# Patient Record
Sex: Male | Born: 2000 | Race: Black or African American | Hispanic: No | Marital: Single | Smoking: Never smoker
Health system: Southern US, Community
[De-identification: ages and names within clinical notes are randomized; demographics above are authoritative.]

## PROBLEM LIST (undated history)

## (undated) HISTORY — PX: SHOULDER SURGERY: SHX246

---
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
## 2021-06-21 DIAGNOSIS — Z0000 Encounter for general adult medical examination without abnormal findings: Secondary | ICD-10-CM | POA: Diagnosis not present

## 2021-06-27 ENCOUNTER — Telehealth: Payer: Self-pay | Admitting: Family Medicine

## 2021-06-27 NOTE — Telephone Encounter (Signed)
Immunization record printed and up front ready for pickup. Mom is aware

## 2021-06-27 NOTE — Telephone Encounter (Signed)
Nurse-patient needs copy of shot record.

## 2021-06-29 DIAGNOSIS — Z23 Encounter for immunization: Secondary | ICD-10-CM | POA: Diagnosis not present

## 2021-10-05 IMAGING — CT CT HEAD W/O CM
3 series · 16 of 47 positions shown, 19 images · non-contrast
Comparison: None.

CLINICAL DATA: Headache, dizziness, and vomiting.

EXAM:
CT HEAD WITHOUT CONTRAST
TECHNIQUE: Contiguous axial images were obtained from the base of the skull
through the vertex without intravenous contrast.

[Series 2: head w o · axial · 0.42mm/px · z∈[+75,+200]mm · 10 of 31 slices shown, 13 images]
[im 3/31  brain]
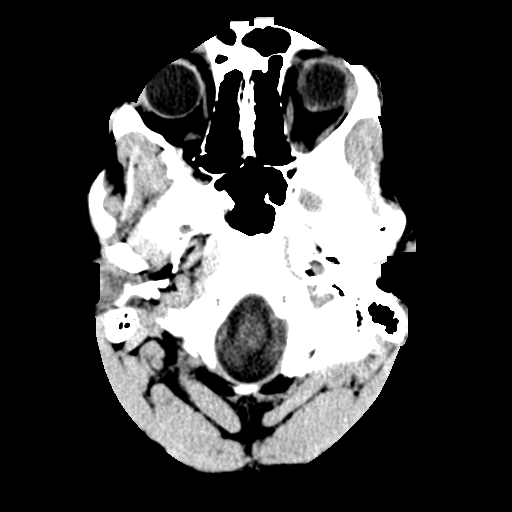
[im 3/31  bone]
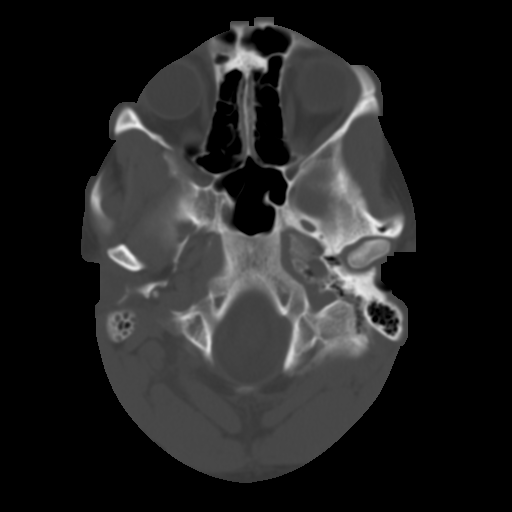
[im 6/31  brain]
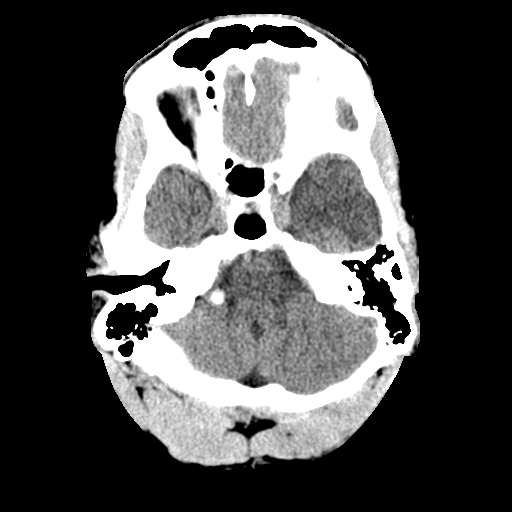
[im 9/31  brain]
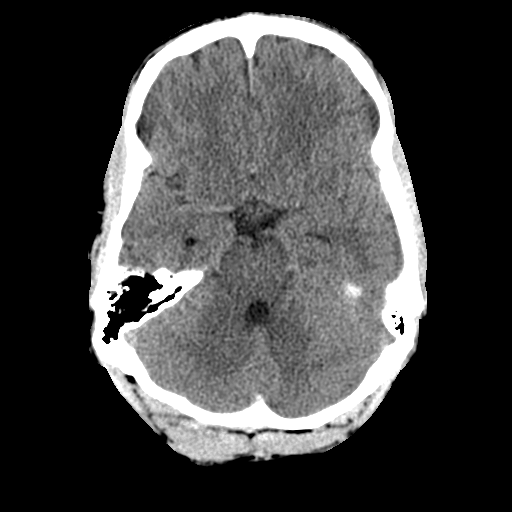
[im 11/31  brain]
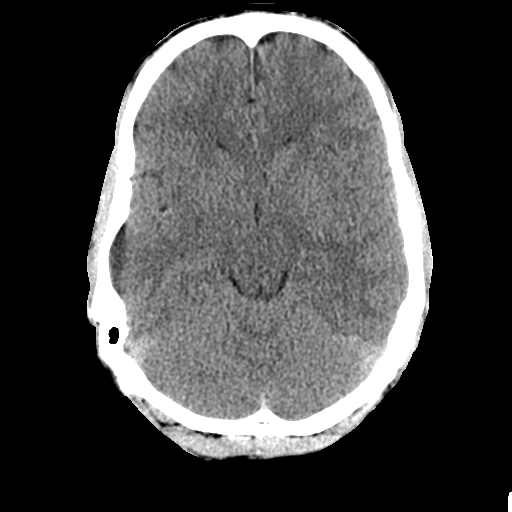
[im 14/31  brain]
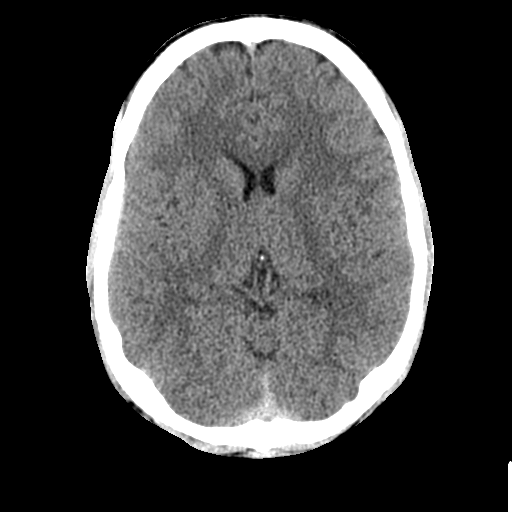
[im 14/31  bone]
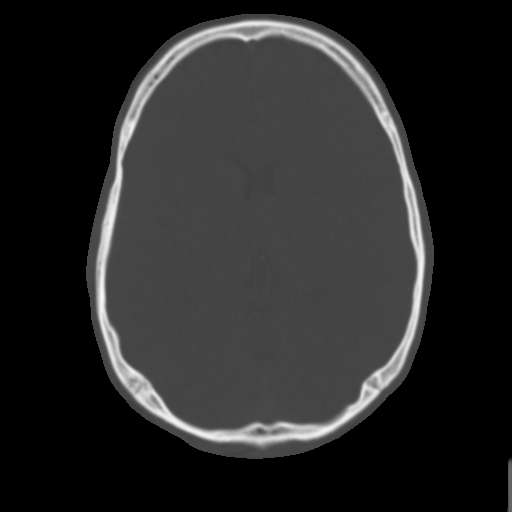
[im 17/31  brain]
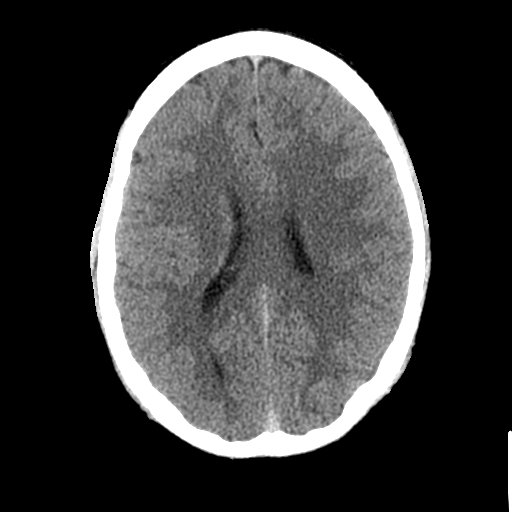
[im 20/31  brain]
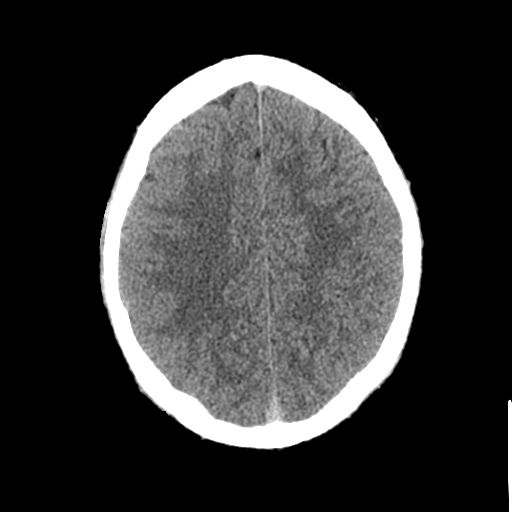
[im 23/31  brain]
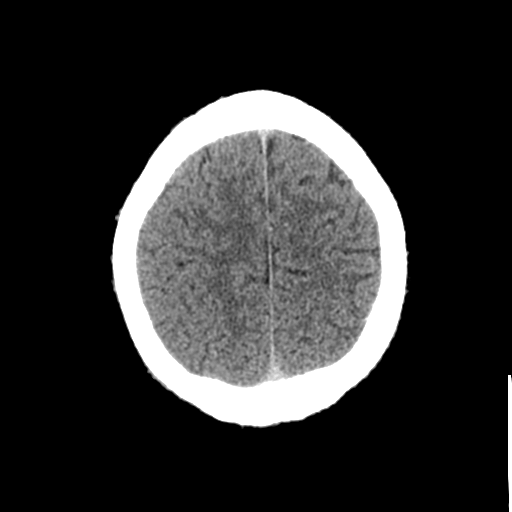
[im 25/31  brain]
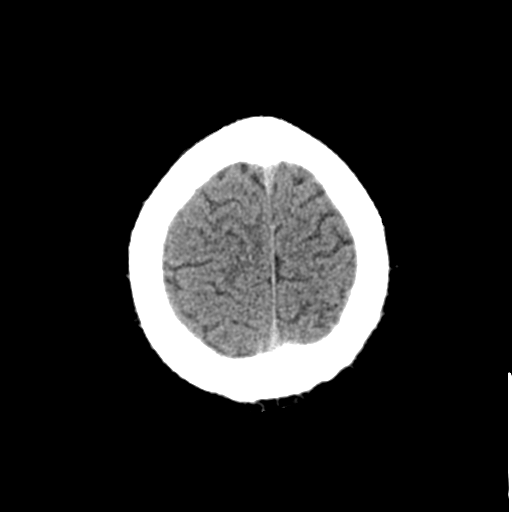
[im 25/31  bone]
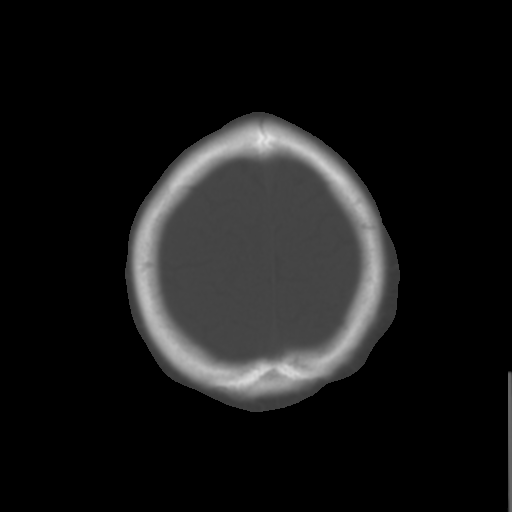
[im 28/31  brain]
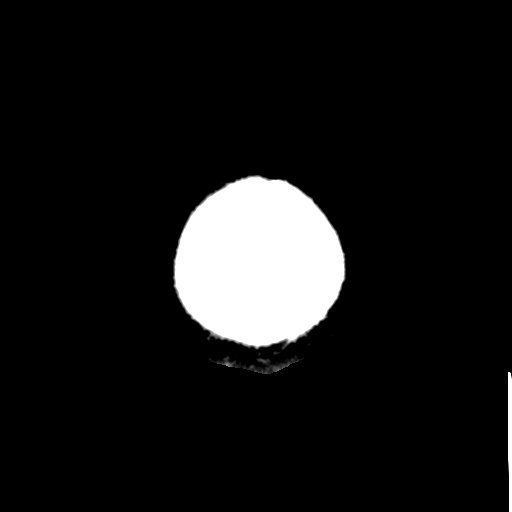

[Series 4: coronal soft · coronal · 0.33mm/px · 3 of 68 slices shown]
[im 23/68  brain]
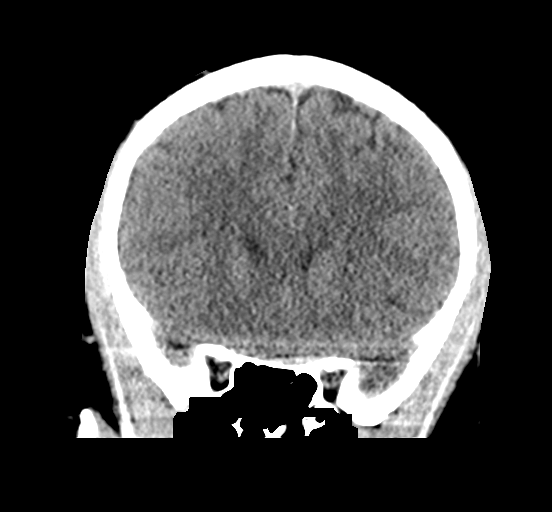
[im 30/68  brain]
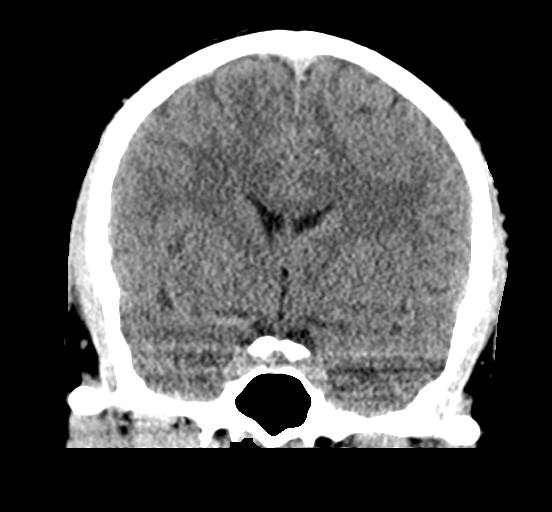
[im 38/68  brain]
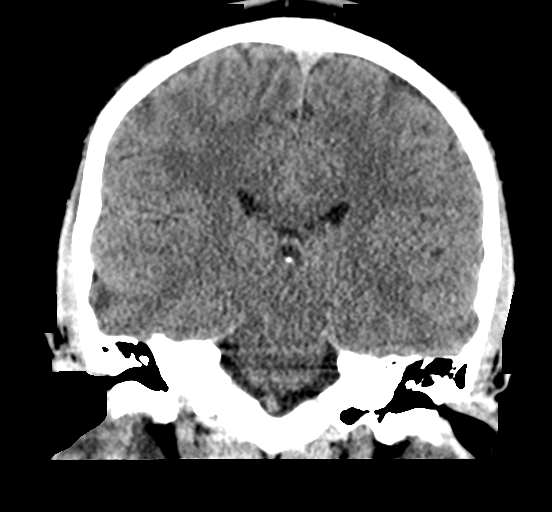

[Series 5: sagittal soft · sagittal · 0.33mm/px · 3 of 60 slices shown]
[im 20/60  brain]
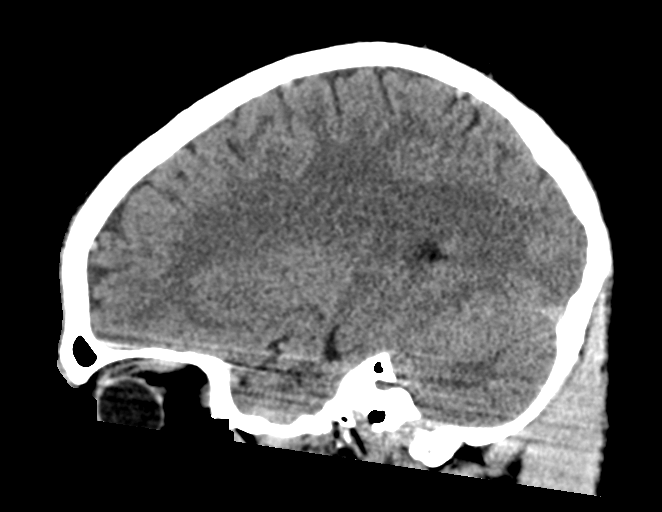
[im 30/60  brain]
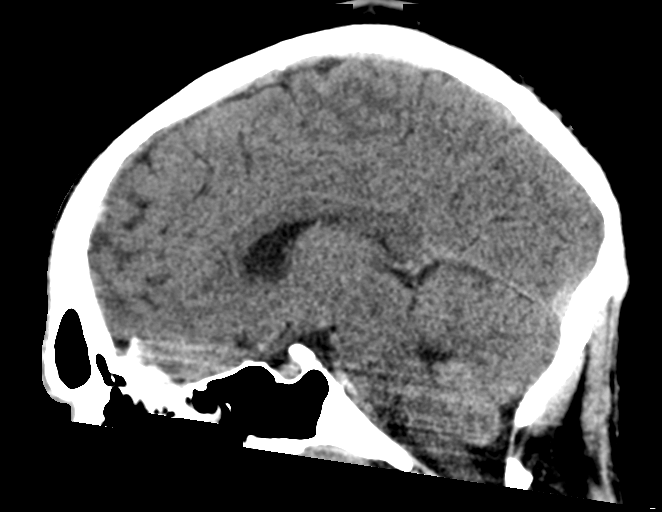
[im 40/60  brain]
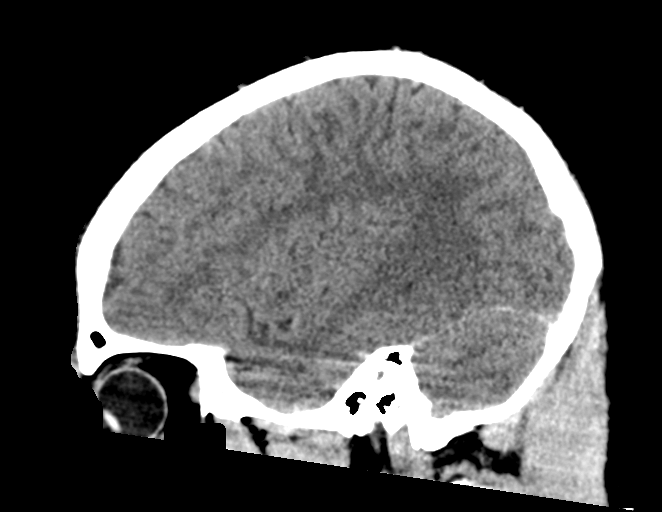

[16 of 47 positions shown; findings below may reference images not displayed]

FINDINGS: Brain: No evidence of acute infarction, hemorrhage, hydrocephalus,
extra-axial collection or mass lesion/mass effect.

Vascular: No hyperdense vessel or unexpected calcification.

Skull: Normal. Negative for fracture or focal lesion.

Sinuses/Orbits: Normal

Other: None
IMPRESSION: Normal exam.

## 2021-12-07 ENCOUNTER — Telehealth: Payer: Self-pay | Admitting: Family Medicine

## 2021-12-07 DIAGNOSIS — Z111 Encounter for screening for respiratory tuberculosis: Secondary | ICD-10-CM

## 2021-12-07 NOTE — Telephone Encounter (Signed)
May have quant to [facility] blood test

## 2021-12-07 NOTE — Telephone Encounter (Signed)
Patient is requesting orders to get TB test done needing it for college.please advise [phone]

## 2021-12-07 NOTE — Telephone Encounter (Signed)
Blood work ordered in Epic. Left message to return call 

## 2021-12-08 NOTE — Telephone Encounter (Signed)
Patient notified

## 2021-12-11 LAB — QUANTIFERON-TB GOLD PLUS
QuantiFERON Mitogen Value: 6.39 IU/mL
QuantiFERON Nil Value: 0.02 IU/mL
QuantiFERON TB1 Ag Value: 0.05 IU/mL
QuantiFERON TB2 Ag Value: 0.06 IU/mL
QuantiFERON-TB Gold Plus: NEGATIVE

## 2022-03-11 IMAGING — DX DG ANKLE COMPLETE 3+V*L*
3 series · 3 of 3 positions shown · non-contrast
Comparison: None.

CLINICAL DATA: Left ankle pain. Injury 1 month ago. Left foot
11/04/2018

EXAM:
LEFT ANKLE COMPLETE - 3+ VIEW

[ankle ap]
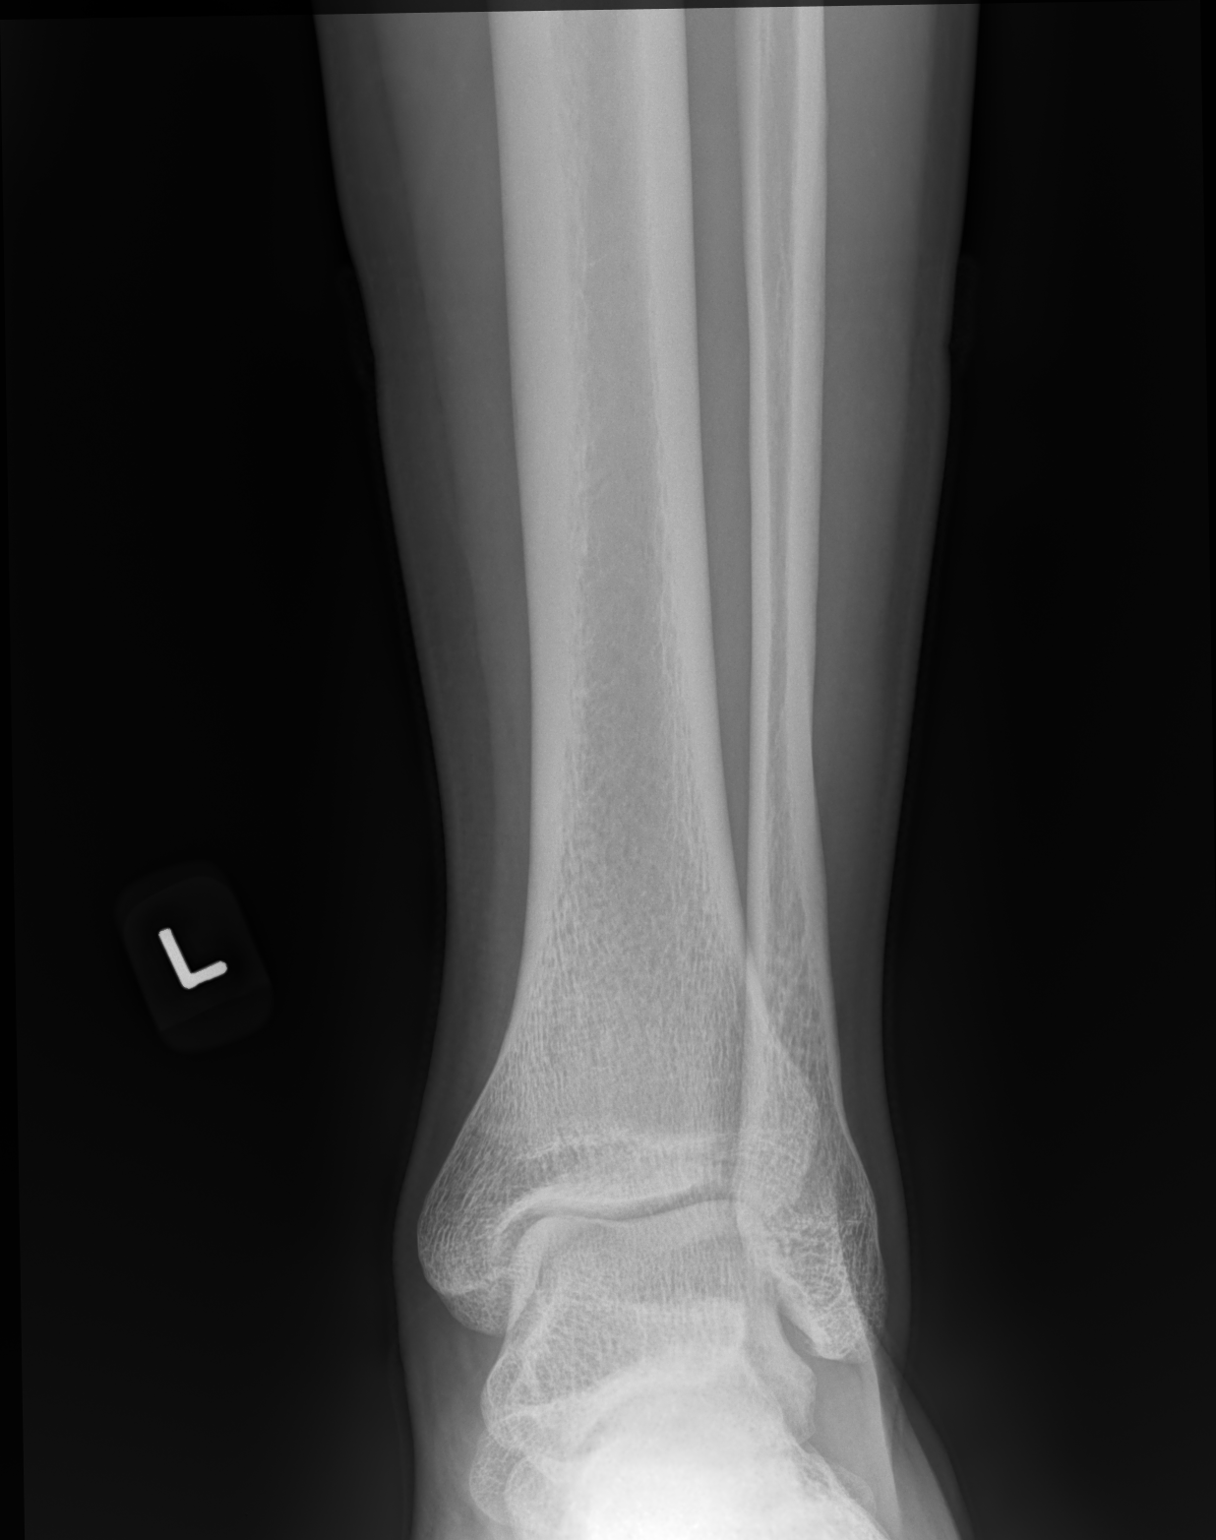

[ankle mlo]
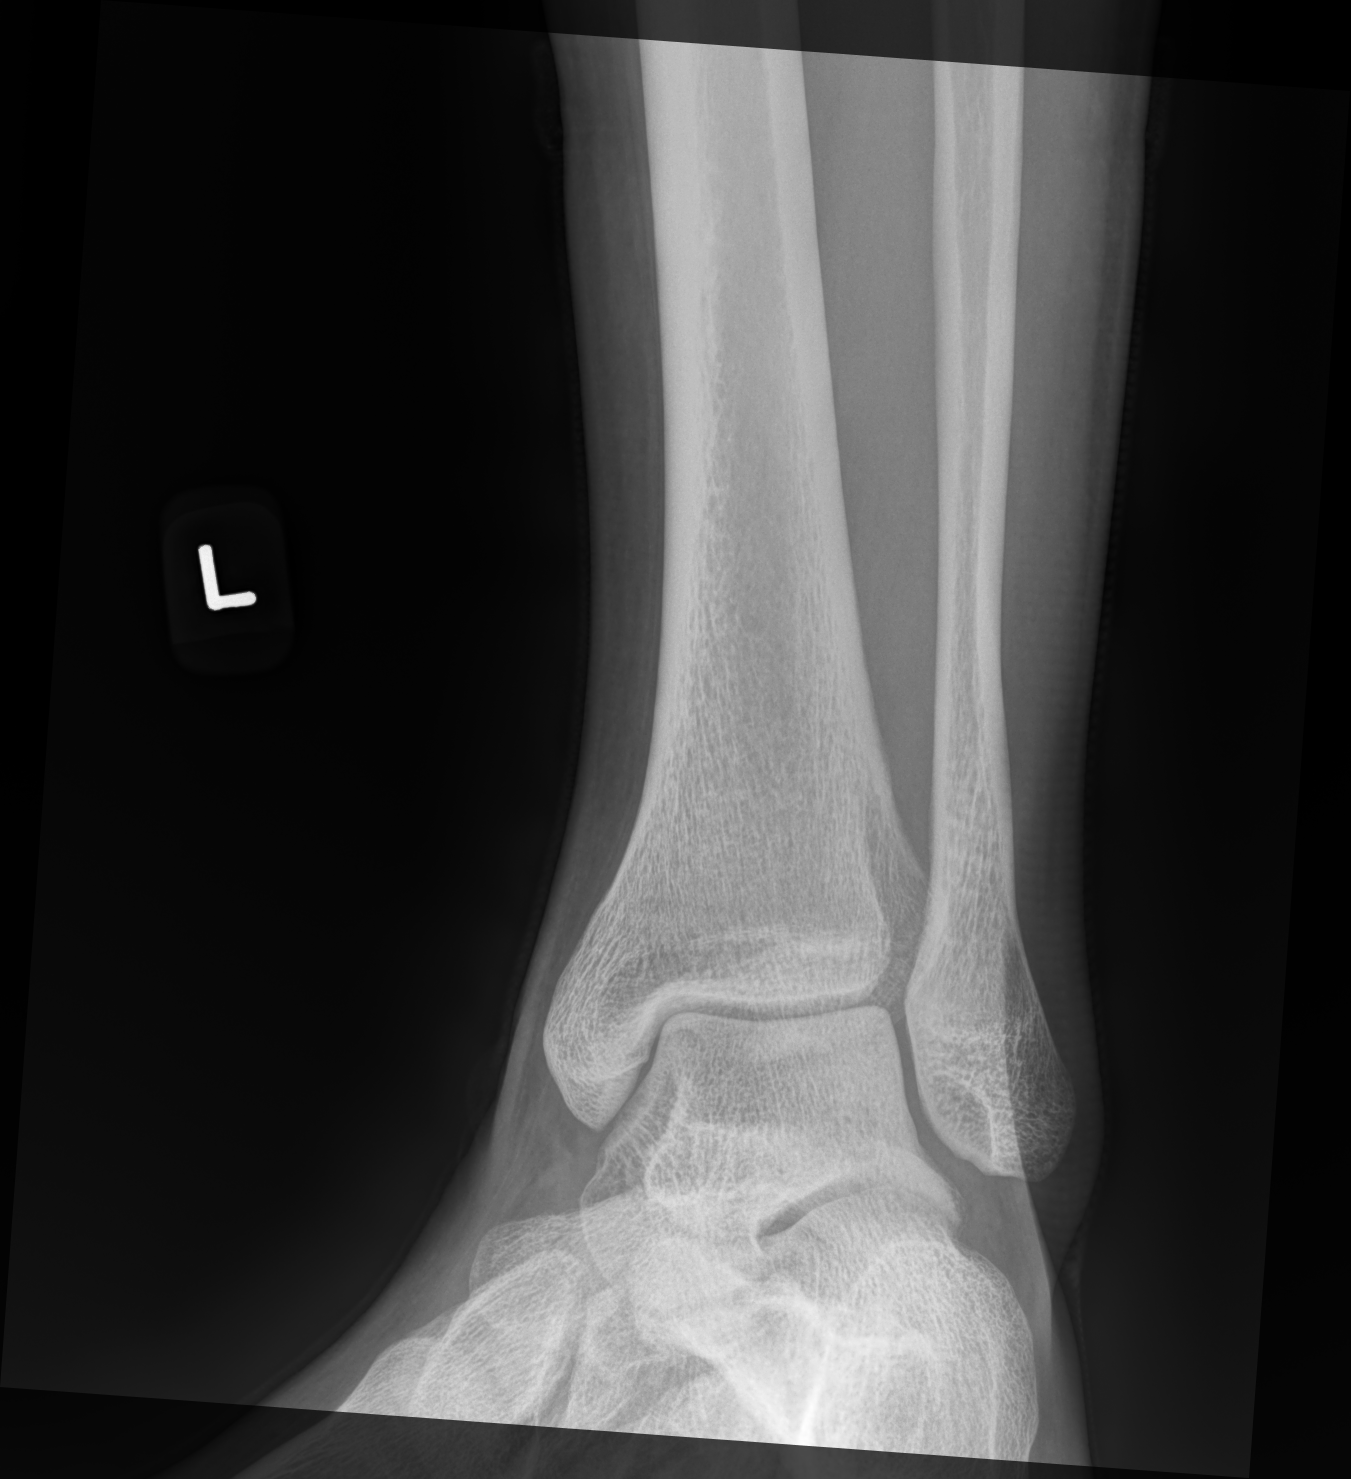

[ankle lat]
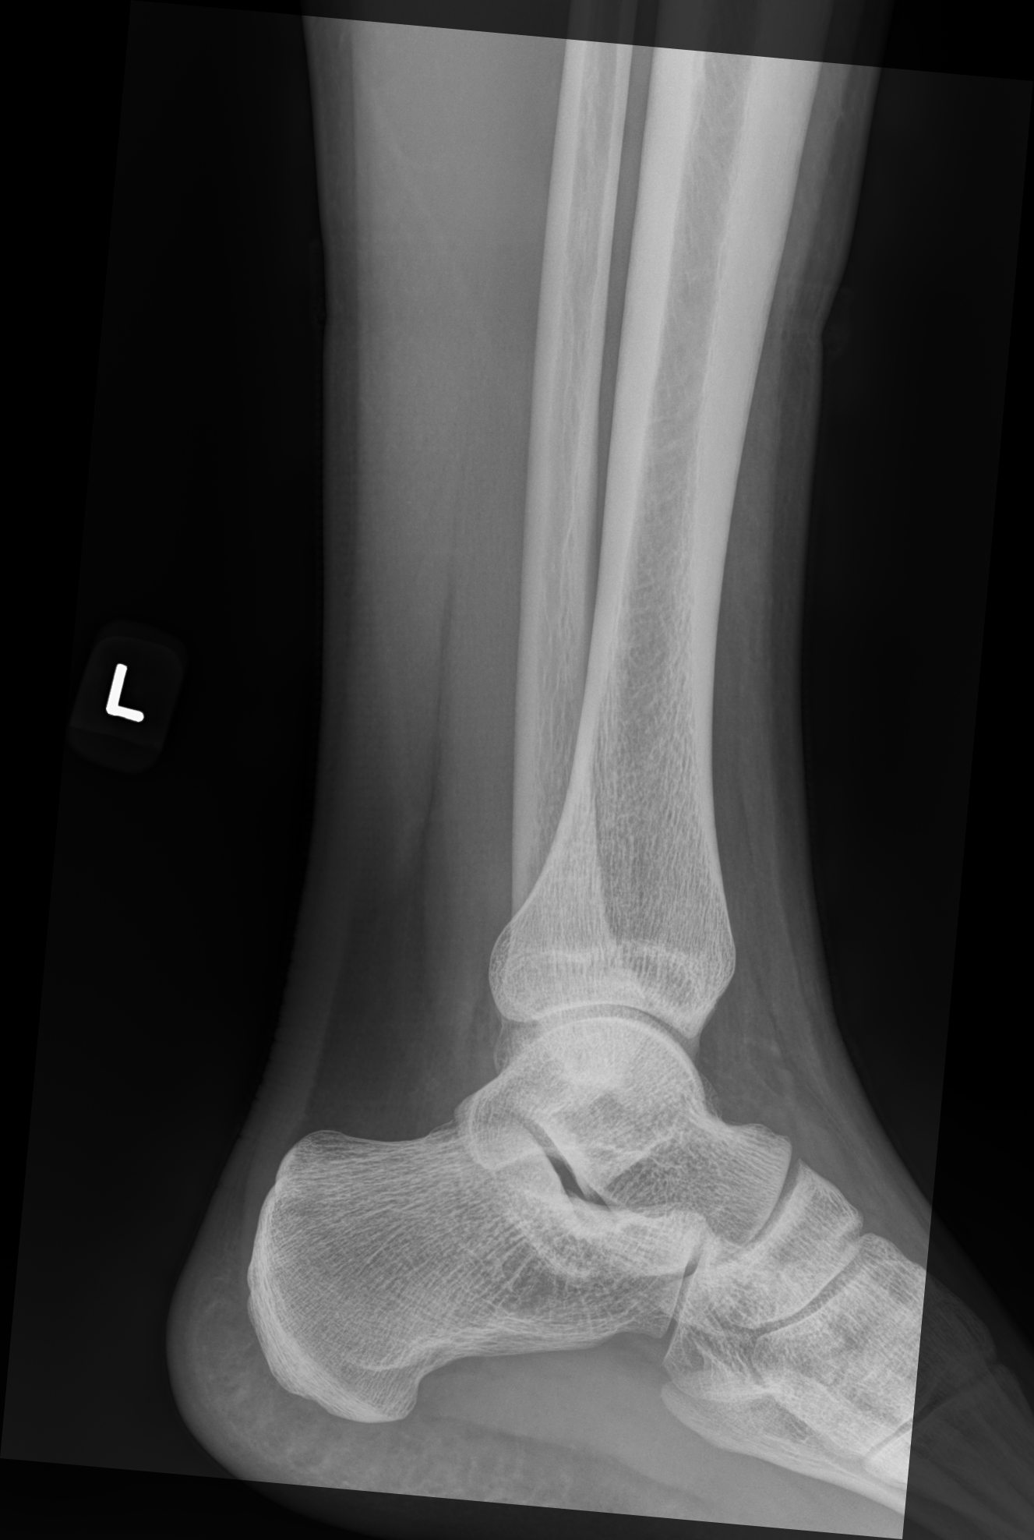

[3 of 3 positions shown; findings below may reference images not displayed]

FINDINGS: There is no evidence of fracture, dislocation, or joint effusion.
There is no evidence of arthropathy or other focal bone abnormality.
Soft tissues are unremarkable.
IMPRESSION: Negative.

## 2022-12-28 ENCOUNTER — Encounter: Payer: Medicaid Other | Admitting: Family Medicine

## 2023-02-20 ENCOUNTER — Ambulatory Visit (HOSPITAL_COMMUNITY)
Admission: RE | Admit: 2023-02-20 | Discharge: 2023-02-20 | Disposition: A | Discharge status: Home / Self Care | Payer: Medicaid Other | Source: Ambulatory Visit | Attending: Emergency Medicine | Admitting: Emergency Medicine

## 2023-02-20 ENCOUNTER — Encounter (HOSPITAL_COMMUNITY): Payer: Self-pay

## 2023-02-20 VITALS — BP 152/89 | HR 109 | Temp 99.2°F | Resp 16

## 2023-02-20 DIAGNOSIS — Z113 Encounter for screening for infections with a predominantly sexual mode of transmission: Secondary | ICD-10-CM

## 2023-02-20 NOTE — Discharge Instructions (Signed)
We will call you if anything on your swab returns positive (1-3 days).  You can also see these results on MyChart.  Please abstain from sexual intercourse until your results return.

## 2023-02-20 NOTE — ED Triage Notes (Signed)
Patient states he has had a new sexual partner and has had intermittent burning when urinating x 2 weeks ago.

## 2023-02-20 NOTE — ED Provider Notes (Signed)
William Estrada    CSN: [id number] Arrival date & time: 02/20/23  1821      History   Chief Complaint Chief Complaint  Patient presents with   SEXUALLY TRANSMITTED DISEASE    general screening wanted - Entered by patient    HPI William Estrada is a 22 y.o. male.  Presents for STD testing Reports unprotected intercourse with a new partner couple weeks ago Intermittent burning with urination since then Denies any penile discharge, irritation, itching, lesion, testicular pain or swelling  Past Medical History:  Diagnosis Date   History of brain concussion 12/31/2019   History of concussion from football normal CT scan February 2021   Migraine headache 12/31/2019    Patient Active Problem List   Diagnosis Date Noted   Nausea 11/25/2020   History of brain concussion 12/31/2019   Migraine headache 12/31/2019   Left otitis media with effusion 10/27/2016    Past Surgical History:  Procedure Laterality Date   SHOULDER ARTHROSCOPY Left 06/07/2020   Procedure: ARTHROSCOPY SHOULDER LABRAL REPAIR WITH DEBRIDEMENT OF CHONDRAL FLAP;  Surgeon: Tania Ade, MD;  Location: [facility];  Service: Orthopedics;  Laterality: Left;   SHOULDER SURGERY Left        Home Medications    Prior to Admission medications   Not on File    Family History Family History  Problem Relation Age of Onset   Diabetes Mother    Diabetes Other    Hypertension Other     Social History Social History   Tobacco Use   Smoking status: Never   Smokeless tobacco: Never  Vaping Use   Vaping Use: Never used  Substance Use Topics   Alcohol use: No   Drug use: No     Allergies   Patient has no known allergies.   Review of Systems Review of Systems Per HPI  Physical Exam Triage Vital Signs ED Triage Vitals [02/20/23 1843]  Enc Vitals Group     BP (!) 152/89     Pulse Rate (!) 109     Resp 16     Temp 99.2 F (37.3 C)     Temp Source Oral     SpO2 98 %      Weight      Height      Head Circumference      Peak Flow      Pain Score 0     Pain Loc      Pain Edu?      Excl. in [place]?    No data found.  Updated Vital Signs BP (!) 152/89 (BP Location: Right Arm)   Pulse (!) 109   Temp 99.2 F (37.3 C) (Oral)   Resp 16   SpO2 98%   Visual Acuity Right Eye Distance:   Left Eye Distance:   Bilateral Distance:    Right Eye Near:   Left Eye Near:    Bilateral Near:     Physical Exam Vitals and nursing note reviewed.  Constitutional:      General: He is not in acute distress.    Appearance: Normal appearance.  HENT:     Mouth/Throat:     Pharynx: Oropharynx is clear.  Cardiovascular:     Rate and Rhythm: Normal rate and regular rhythm.     Pulses: Normal pulses.  Pulmonary:     Effort: Pulmonary effort is normal.  Neurological:     Mental Status: He is alert and oriented to person, place,  and time.      UC Treatments / Results  Labs (all labs ordered are listed, but only abnormal results are displayed) Labs Reviewed  CYTOLOGY, (ORAL, ANAL, URETHRAL) ANCILLARY ONLY    EKG   Radiology No results found.  Procedures Procedures (including critical care time)  Medications Ordered in UC Medications - No data to display  Initial Impression / Assessment and Plan / UC Course  I have reviewed the triage vital signs and the nursing notes.  Pertinent labs & imaging results that were available during my care of the patient were reviewed by me and considered in my medical decision making (see chart for details).  Cytology swab is pending.  Discussed safe sex practices, provided with STD pamphlet and condom baggie. Declines blood work today No questions at this time  Final Clinical Impressions(s) / UC Diagnoses   Final diagnoses:  Screen for STD (sexually transmitted disease)     Discharge Instructions      We will call you if anything on your swab returns positive (1-3 days).  You can also see these results on  MyChart.  Please abstain from sexual intercourse until your results return.     ED Prescriptions   None    PDMP not reviewed this encounter.   Kyra Leyland 02/20/23 [id number]

## 2023-02-21 LAB — CYTOLOGY, (ORAL, ANAL, URETHRAL) ANCILLARY ONLY
Chlamydia: NEGATIVE
Comment: NEGATIVE
Comment: NEGATIVE
Comment: NORMAL
Neisseria Gonorrhea: NEGATIVE
Trichomonas: NEGATIVE
